# Patient Record
Sex: Male | Born: 1985 | Hispanic: Yes | Marital: Married | Smoking: Never smoker
Health system: Southern US, Community
[De-identification: ages and names within clinical notes are randomized; demographics above are authoritative.]

## PROBLEM LIST (undated history)

## (undated) HISTORY — PX: INNER EAR SURGERY: SHX679

---

## 2008-06-19 ENCOUNTER — Emergency Department: Payer: Self-pay | Admitting: Emergency Medicine

## 2011-05-08 ENCOUNTER — Emergency Department: Payer: Self-pay | Admitting: Emergency Medicine

## 2011-10-22 ENCOUNTER — Emergency Department: Payer: Self-pay | Admitting: Unknown Physician Specialty

## 2015-12-22 ENCOUNTER — Emergency Department: Payer: No Typology Code available for payment source

## 2015-12-22 ENCOUNTER — Encounter: Payer: Self-pay | Admitting: Emergency Medicine

## 2015-12-22 ENCOUNTER — Emergency Department
Admission: EM | Admit: 2015-12-22 | Discharge: 2015-12-22 | Disposition: A | Discharge status: Home / Self Care | Payer: No Typology Code available for payment source | Attending: Emergency Medicine | Admitting: Emergency Medicine

## 2015-12-22 DIAGNOSIS — M25511 Pain in right shoulder: Secondary | ICD-10-CM | POA: Insufficient documentation

## 2015-12-22 DIAGNOSIS — Y999 Unspecified external cause status: Secondary | ICD-10-CM | POA: Insufficient documentation

## 2015-12-22 DIAGNOSIS — M545 Low back pain, unspecified: Secondary | ICD-10-CM

## 2015-12-22 DIAGNOSIS — S199XXA Unspecified injury of neck, initial encounter: Secondary | ICD-10-CM | POA: Diagnosis present

## 2015-12-22 DIAGNOSIS — Y939 Activity, unspecified: Secondary | ICD-10-CM | POA: Insufficient documentation

## 2015-12-22 DIAGNOSIS — Y929 Unspecified place or not applicable: Secondary | ICD-10-CM | POA: Insufficient documentation

## 2015-12-22 DIAGNOSIS — S161XXA Strain of muscle, fascia and tendon at neck level, initial encounter: Secondary | ICD-10-CM | POA: Diagnosis not present

## 2015-12-22 MED ORDER — CYCLOBENZAPRINE HCL 10 MG PO TABS: 10.0000 mg | ORAL_TABLET | Freq: Every day | ORAL | Status: AC

## 2015-12-22 MED ORDER — NAPROXEN 500 MG PO TABS: 500.0000 mg | ORAL_TABLET | Freq: Two times a day (BID) | ORAL | Status: DC

## 2015-12-22 NOTE — ED Notes (Signed)
Pt verbalized understanding of discharge instructions. NAD at this time. 
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
## 2015-12-22 NOTE — Discharge Instructions (Signed)
Ejercicios para la espalda (Back Exercises) Los siguientes ejercicios fortalecen los msculos que dan soporte a la espalda y, [place], ayudan a Theatre manager la flexibilidad de la zona lumbar. Hacer estos ejercicios puede ser de ayuda para Information systems manager de espalda o Best boy actual. Si tiene dolor o molestias en la espalda, intente hacer estos ejercicios 2 o 3veces por da, o como se lo haya indicado el mdico. Cuando el dolor desaparezca, hgalos una vez por da, pero haga ms repeticiones de cada ejercicio. Si no tiene dolor o [employer] espalda, haga estos ejercicios una vez por da o como se lo haya indicado el mdico. EJERCICIOS Rodilla al pecho Repita estos pasos 3 o 5veces con cada pierna:  Acustese boca arriba sobre una cama dura o sobre el suelo con las piernas extendidas.  Lleve una rodilla al pecho. La otra pierna debe quedar extendida y en contacto con el suelo.  Mantenga la rodilla contra el pecho. Para lograrlo tmese la rodilla o el muslo.  Tire de la rodilla hasta sentir una elongacin suave en la parte baja de la espalda.  Mantenga la elongacin durante 10 a 30segundos.  Suelte y extienda la pierna lentamente. Inclinacin de la pelvis Repita estos pasos 5 o 10veces:  Acustese boca arriba sobre una cama dura o sobre el suelo con las piernas extendidas.  Flexione las rodillas de modo que apunten al techo y los pies queden apoyados en el suelo.  Contraiga los msculos de la parte baja del abdomen para empujar la zona lumbar contra el suelo. Con este movimiento se inclinar la pelvis de modo que el cccix apunte hacia el techo, en lugar de apuntar en direccin a los pies o al suelo.  Contraiga suavemente y respire con normalidad mientras mantiene esta posicin durante 5 a 10segundos. El perro y el gato Repita estos pasos hasta que la zona lumbar se vuelva ms flexible:  [place] manos y las rodillas sobre una superficie firme. Las manos  deben estar alineadas con los hombros y las rodillas con las caderas. Puede colocarse almohadillas debajo de las rodillas para estar cmodo.  Deje caer la cabeza y baje el cccix en direccin al suelo de modo que la zona lumbar se arquee como el lomo de un [place].  Mantenga esta posicin durante 5segundos.  Lentamente, levante la cabeza y eleve el cccix de modo que apunte en direccin al techo para que la espalda forme un arco hundido como el lomo de un perro contento.  Mantenga esta posicin durante 5segundos. Flexiones de English as a second language teacher pasos 5 o 10veces:  Acustese boca abajo en el suelo.  [place] manos cerca de la cabeza, separadas aproximadamente al ancho de los hombros.  Con la espalda lo ms relajada posible y las caderas apoyadas en el suelo, extienda lentamente los brazos para levantar la mitad superior del cuerpo y Community education officer los hombros. No use los msculos de la espalda para elevar la parte superior del torso. Puede cambiar las manos de lugar para estar ms cmodo.  Mantenga esta posicin durante 5segundos mientras mantiene la espalda relajada.  Lentamente vuelva a la posicin horizontal. Puentes Repita estos pasos 10veces:  Acustese boca arriba sobre una superficie firme.  Flexione las rodillas de modo que apunten al techo y los pies queden apoyados en el suelo.  Contraiga los glteos y despegue las nalgas del suelo hasta que la cintura est casi a la misma altura que las rodillas. Debe  sentir el trabajo muscular en las nalgas y la parte posterior de los muslos. Si no siente el esfuerzo de [employer], aleje los pies 1 o 2pulgadas (2,5 o 5centmetros) de las nalgas.  Mantenga esta posicin durante 3 a 5segundos.  Baje lentamente las caderas a la posicin inicial y relaje los glteos por completo. Si este ejercicio le resulta muy fcil, intente realizarlo con los brazos cruzados [place]. Abdominales Repita estos pasos 5 o  10veces:  Acustese boca arriba sobre una cama dura o sobre el suelo con las piernas extendidas.  Flexione las rodillas de modo que apunten al techo y los pies queden apoyados en el suelo.  Cruce los World Fuel Services Corporation.  Baje levemente el mentn en direccin al pecho sin doblar el cuello.  Contraiga los msculos del abdomen y con lentitud eleve el torso lo suficiente como para Artist los omplatos del suelo. No eleve el torso ms alto que eso, porque esto puede sobreexigir a la zona lumbar y no ayuda a Investment banker, operational.  Regrese lentamente a la posicin inicial. Elevaciones de espalda Repita estos pasos 5 o 10veces:  Acustese boca abajo con los brazos a los costados del cuerpo y apoye la frente en el suelo.  Contraiga los msculos de las piernas y los glteos.  Lentamente despegue el pecho del suelo mientras mantiene las caderas bien apoyadas en el suelo. Mantenga la nuca alineada con la curvatura de la espalda. Los ojos deben mirar al suelo.  Mantenga esta posicin durante 3 a 5segundos.  Regrese lentamente a la posicin inicial. SOLICITE ATENCIN MDICA SI:  El dolor o las molestias en la espalda se vuelven mucho ms intensos cuando hace un ejercicio.  El dolor o las molestias en la espalda no se Copy en el trmino de las 2horas posteriores a Copy. Si tiene alguno de [employer] Brands, deje de [employer]AM[employer] Corporation ejercicios de inmediato. No vuelva a hacer los ejercicios a menos que el mdico lo autorice. SOLICITE ATENCIN MDICA DE INMEDIATO SI:  Siente un dolor sbito e intenso en la espalda. Si esto ocurre, deje de [employer] Corporation ejercicios de inmediato. No vuelva a hacer los ejercicios a menos que el mdico lo autorice.   Esta informacin no tiene Theme park manager el consejo del mdico. Asegrese de hacerle al mdico cualquier pregunta que tenga.   Document Released: 07/28/2005 Document Revised: 04/18/2015 Elsevier Interactive  Patient Education [employer]   Distensin muscular. (Muscle Strain) Una distensin muscular es una lesin que se produce cuando un msculo se estira ms all de su largo normal. Cuando esto sucede, por lo general se desgarra un pequeo nmero de fibras musculares. La distensin muscular se califica en grados. Las distensiones de Museum/gallery conservator son aquellas en las cuales el desgarro y el dolor afectan a la menor cantidad de fibras musculares. Las distensiones de segundo y tercer grado involucran una proporcin cada vez mayor de desgarro y Engineer, mining.  En general, la recuperacin de una distensin muscular tarda de 1 a 2semanas. La curacin completa tarda de 5 a 6semanas.  CAUSAS  Las distensiones musculares ocurren cuando se aplica una fuerza violenta y repentina sobre un msculo y este se estira demasiado. Esto puede ocurrir cuando se Hydrographic surveyor, se practican deportes o en una cada.  FACTORES DE RIESGO La distensin muscular es especialmente comn en los atletas.  SIGNOS Y SNTOMAS En el lugar de la distensin muscular se puede presentar lo siguiente:  Dolor.  Moretones.  Hinchazn.  Dificultad para usar el msculo debido al dolor o a un funcionamiento anormal. DIAGNSTICO  El mdico le har un examen fsico y le har preguntas sobre sus antecedentes mdicos. TRATAMIENTO  Con frecuencia, el mejor tratamiento para una distensin muscular es el reposo, y la aplicacin de hielo y de compresas fras en la zona de la lesin.  INSTRUCCIONES PARA EL CUIDADO EN EL HOGAR   Use el mtodo PRICE (por sus siglas en ingls) de tratamiento para estimular la curacin durante los primeros 2 a 3das posteriores a la lesin. El mtodo PRICE implica lo siguiente:  Proteger al msculo de nuevas lesiones.  Limitar la actividad y Lawyer la parte del cuerpo lesionada.  Aplicar hielo a la lesin. Para hacerlo, ponga hielo en una bolsa plstica. Coloque una toalla entre la piel y la bolsa de  hielo. Luego aplique el hielo y djelo actuar de 15 a por hora. Despus del Press photographer, cambie a compresas de calor hmedo.  Comprimir la zona lesionada con una frula o venda elstica. Tenga cuidado de no ajustarla demasiado. Esto puede interferir con la circulacin sangunea o aumentar la hinchazn.  Mantener la zona lesionada por encima del nivel del corazn con la mayor frecuencia posible.  Utilice los medicamentos de venta libre o recetados para Primary school teacher, el malestar o la fiebre, segn se lo indique el mdico.  Education officer, environmental un calentamiento antes de hacer ejercicio ayuda a prevenir distensiones musculares futuras. SOLICITE ATENCIN MDICA SI:   Siente un dolor cada vez ms intenso o hinchazn en la zona lesionada.  Siente adormecimiento, hormigueo o nota una prdida importante de fuerza en la zona lesionada. ASEGRESE DE QUE:   Comprende estas instrucciones.  Controlar su afeccin.  Recibir ayuda de inmediato si no mejora o si empeora.   Esta informacin no tiene Theme park manager el consejo del mdico. Asegrese de hacerle al mdico cualquier pregunta que tenga.   Document Released: 05/07/2005 Document Revised: 05/18/2013 Elsevier Interactive Patient Education 2016 [employer]. Dolor de espalda en adultos (Back Pain, Adult) El dolor de espalda es muy frecuente. A menudo mejora con el tiempo. La causa del dolor de espalda generalmente no es peligrosa. La Harley[employer]Davidson de las personas puede aprender a Runner, broadcasting/film/video de espalda por s mismas.  CUIDADOS EN EL HOGAR  Controle su dolor de espalda a fin de Public house manager cambio. Las siguientes indicaciones ayudarn a Psychologist, clinical que pueda sentir:  Materials engineer. Comience con caminatas cortas sobre superficies planas si es posible. Trate de caminar un poco ms cada da.  Haga ejercicios con regularidad tal como le indic el mdico. El ejercicio ayuda a que su espalda se cure ms rpidamente. Tambin  ayuda a prevenir futuras lesiones al [employer] fuertes y flexibles.  No se siente, conduzca ni permanezca de pie durante ms de 30 minutos.  No permanezca en la cama. Si hace reposo ms de 1 a 2 das, puede demorar su recuperacin.  Sea cuidadoso al inclinarse o levantar un objeto. Use una tcnica apropiada para levantar peso:  Flexione las rodillas.  Mantenga el objeto cerca del cuerpo.  No gire.  Duerma sobre un NVR Inc. Recustese sobre un costado y flexione las rodillas. Si se recuesta Fisher Scientific, coloque una almohada debajo de las rodillas.  Tome los medicamentos solamente como se lo haya indicado el mdico.  Aplique hielo sobre la zona lesionada.  Ponga el hielo en una bolsa plstica.  Coloque una FirstEnergy Corp  piel y la bolsa de hielo.  Deje el hielo durante , 2 a 3veces por da, durante los primeros 2 o 3das. Despus de eso, puede alternar entre compresas de hielo y Airline pilot.  Evite sentir ansiedad o estrs. Encuentre maneras efectivas de lidiar con el estrs, Surveyor, mining ejercicio.  Mantenga un peso saludable. El peso excesivo ejerce tensin sobre la espalda. SOLICITE AYUDA SI:   Siente dolor que no se alivia con reposo o medicamentos.  Siente cada vez ms dolor que se extiende a las piernas o los glteos.  El dolor no mejora en una semana.  Siente dolor por la noche.  Pierde peso.  Siente escalofros o fiebre. SOLICITE AYUDA DE INMEDIATO SI:   No puede controlar su materia fecal (heces) o el pis (orina).  Siente debilidad en las piernas o los brazos.  Siente prdida de la sensibilidad (adormecimiento) en las piernas o los brazos.  Tiene malestar estomacal (nuseas) o vomita.  Siente dolor de estmago (abdominal).  Siente que se desvanece (se desmaya).   Esta informacin no tiene Theme park manager el consejo del mdico. Asegrese de hacerle al mdico cualquier pregunta que tenga.   Document Released: 02/10/2011  Document Revised: 08/18/2014 Elsevier Interactive Patient Education 2016 [employer].   Crioterapia  (Cryotherapy)  El trmino crioterapia significa tratamiento mediante el fro. Bolsas con hielo o gel se utilizan para reducir Chief Technology Officer y la inflamacin. El hielo es ms efectivo dentro de las primeras 24 a 48 horas despus de una lesin o trastornos por uso excesivo de un msculo o Risk analyst. El hielo puede calmar esguinces, distensiones, espasmos, ardor, dolor punzante y Valero Energy. Tambin puede usarse para la recuperacin luego de [place]. El hielo es [place], tiene muy pocos efectos adversos y es seguro para que lo utilicen la mayora de [employer]on.  PRECAUCIONES  El hielo no es una opcin segura de tratamiento para las personas con:   Fenmeno de [place]. Este es un trastorno que afecta los vasos sanguneos pequeos en las extremidades. La exposicin al fro DTE Energy Company problemas vuelvan.  Hipersensibilidad al fro. Hay diferentes tipos de hipersensibilidad al fro, [employer] se incluyen:  Urticaria por el fro. Ronchas rojas y que pican que aparecen en la piel cuando los tejidos comienzan a calentarse despus de recibir el fro.  Eritema por fro. Se trata de una erupcin de color rojo y que pica, causada por la exposicin al fro.  Hemoglobinuria por fro. Los glbulos rojos se destruyen cuando los tejidos comienzan a calentarse despus de enfriarse. La hemoglobina que transporta oxgeno pasa a la orina debido a que no se puede combinar con protenas de la sangre lo suficientemente rpido.  Entumecimiento o alteracin de la sensibilidad en el rea que se enfra. Si usted tiene Health Net, no utilice hielo hasta que haya hablado con su mdico:   Enfermedades cardacas, como arritmias, angina o enfermedad cardaca crnica.  Hipertensin arterial.  Heridas que se estn curando o abiertas en la zona en la que va a aplicar el  hielo.  Infecciones actuales.  Artritis reumatoidea.  Mala circulacin.  Diabetes. El hielo disminuye el flujo de sangre en la regin en la que se aplica. Esto es beneficioso cuando se trata de evitar que se propaguen ciertas sustancias qumicas irritantes desde los tejidos inflamados a los tejidos circundantes. Sin embargo, si se expone la piel a las temperaturas fras durante demasiado tiempo o sin la proteccin [place], puede daarse la piel  o los nervios. Observe si hay seales de dao en la piel debido al fro.  INSTRUCCIONES PARA EL CUIDADO EN EL HOGAR  Siga estos consejos para usar hielo y compresas fras con seguridad.   Coloque una toalla seca o hmeda entre el hielo y la piel. Una toalla hmeda se enfriar ms rpidamente la piel, lo que puede hacer necesario acortar el tiempo que se utiliza el hielo.  Para obtener una respuesta ms rpida, puede comprimir suavemente el hielo.  Aplique el hielo durante no ms de 10 a 20 minutos a la vez. Cuanto ms hueso haya en la zona en la que aplique el hielo, menos tiempo se necesitar para obtener los beneficios.  Revise su piel despus de 5 minutos para asegurarse de que no hay seales de [employer] al fro o un dao en la piel.  Descanse 20 minutos o ms Union Pacific Corporation.  Una vez que la piel est adormecida, puede finalizar el [place]. Puede probar si hay adormecimiento tocando ligeramente la piel. El toque debe ser tan ligero que no deje un hoyuelo en la piel por la presin hecha con la punta del dedo. Al aplicar hielo, la Harley-Davidson de las personas sentirn sensaciones normales en este orden: fro, ardor, dolor y entumecimiento.  No use hielo sobre alguien que no puede comunicar sus respuestas al dolor, como los nios pequeos o personas con demencia. CMO HACER UNA COMPRESA DE HIELO  Las compresas de hielo son el modo ms frecuente de Chemical engineer la terapia con hielo. Otros mtodos son los masajes con hielo, baos de  hielo, y aerosol fro. Las cremas musculares que producen fro, sensacin de hormigueo no ofrecen los mismos beneficios que ofrece el hielo y no debe ser utilizado como un sustituto excepto que lo recomiende su mdico.  Para hacer una compresa de hielo, haga lo siguiente:   Ponga hielo picado o una bolsa de verduras congeladas en una bolsa de plstico con cierre. Extraiga el exceso de [place]. Coloque esta bolsa dentro de [place] bolsa de plstico. Deslice la bolsa en una funda de almohada o coloque una toalla hmeda entre su piel y la [place].  Mezcle 3 partes de agua con 1 parte de alcohol fino. Congelar la mezcla en una bolsa plstica con cierre. Cuando se retira Set designer del Electrical engineer, tendr un aspecto fangoso. Extraiga el exceso de [place]. Coloque esta bolsa dentro de [place] bolsa de plstico. Deslice la bolsa en una funda de almohada o coloque una toalla hmeda entre su piel y la [place]. SOLICITE ATENCIN MDICA SI:   Tiene manchas blancas en la piel. Esto puede dar a la piel una apariencia (moteada).  Su piel se vuelve azul o plida.  Tiene un aspecto ceroso o est dura.  La hinchazn empeora. ASEGRESE DE QUE:   Comprende estas instrucciones.  Controlar su enfermedad.  Solicitar ayuda de inmediato si no mejora o si empeora.   Esta informacin no tiene Theme park manager el consejo del mdico. Asegrese de hacerle al mdico cualquier pregunta que tenga.   Document Released: 07/17/2011 Document Revised: 10/20/2011 Elsevier Interactive Patient Education 2016 Elsevier Inc.  Terapia con calor (Heat Therapy) La terapia con calor puede ayudar a aliviar articulaciones y msculos doloridos, lesionados, tensos y rgidos. El calor Mirant, lo cual puede ayudar a Engineer, materials. La terapia con calor solo se debe usar en lesiones viejas, preexistentes o de larga duracin (crnicas). No use la terapia con calor a menos que se lo haya indicado  el mdico. CMO USAR LA TERAPIA CON  CALOR Existen varios tipos distintos de terapia con calor, como:  Compresas hmedas calientes.  Bao de agua caliente.  Bolsa de agua caliente.  Almohadilla trmica.  Bolsa de gel caliente.  Vendaje caliente.  Almohadilla trmica. RECOMENDACIONES GENERALES PARA LA TERAPIA CON CALOR   No duerma mientras [place] la terapia con calor. Utilice la terapia con calor solo mientras est despierto.  La piel puede volverse rosada mientras [place] la terapia con calor. No use la terapia con calor si la piel se pone roja.  No use la terapia con calor si siente un dolor nuevo.  Una temperatura muy alta o una exposicin prolongada al calor puede causar quemaduras. Sea cauto con la terapia de calor para evitar quemar la piel.  No use la terapia con calor en zonas de la piel que ya estn irritadas, como con una erupcin o una quemadura de sol. SOLICITE AYUDA SI:   Observa ampollas, enrojecimiento, hinchazn o adormecimiento.  Siente un dolor nuevo.  El dolor [place] Cong[place]eempeora. ASEGRESE DE QUE:  Comprende estas instrucciones.  Controlar su afeccin.  Recibir ayuda de inmediato si no mejora o si empeora.   Esta informacin no tiene Theme park managercomo fin reemplazar el consejo del mdico. Asegrese de hacerle al mdico cualquier pregunta que tenga.   Document Released: 10/20/2011 Document Revised: 08/18/2014 Elsevier Interactive Patient Education 2016 Arvin[employer]itor[employer]vier Inc.  Colisin con un vehculo de motor (Tourist information centre managerMotor Vehicle Collision) Despus de sufrir un accidente automovilstico, es normal tener diversos hematomas y Smith Internationaldolores musculares. Generalmente, estas molestias son peores durante las primeras 24 horas. En las primeras horas, probablemente sienta mayor entumecimiento y Engineer, miningdolor. Tambin puede sentirse peor al despertarse la maana posterior a la colisin. A partir de all, debera comenzar a Associate Professormejorar da a da. La velocidad con que se mejora generalmente depende de la gravedad de la colisin y la cantidad, Chinaubicacin y  Firefighternaturaleza de las lesiones. INSTRUCCIONES PARA EL CUIDADO EN EL HOGAR   Aplique hielo sobre la zona lesionada.  Ponga el hielo en una bolsa plstica.  Colquese una toalla entre la piel y la bolsa de hielo.  Deje el hielo durante 15 a 20minutos, 3 a 4veces por da, o segn las indicaciones del mdico.  Albesa SeenBeba suficiente lquido para mantener la orina clara o de color amarillo plido. No beba alcohol.  Tome una ducha o un bao tibio una o dos veces al da. Esto aumentar el flujo de Computer Sciences Corporationsangre hacia los msculos doloridos.  Puede retomar sus actividades normales cuando se lo indique el mdico. Tenga cuidado al levantar objetos, ya que puede agravar el dolor en el cuello o en la espalda.  Utilice los medicamentos de venta libre o recetados para Primary school teachercalmar el dolor, el malestar o la fiebre, segn se lo indique el mdico. No tome aspirina. Puede aumentar los hematomas o la hemorragia. SOLICITE ATENCIN MDICA DE INMEDIATO SI:  Tiene entumecimiento, hormigueo o debilidad en los brazos o las piernas.  Tiene dolor de cabeza intenso que no mejora con medicamentos.  Siente un dolor intenso en el cuello, especialmente con la palpacin en el centro de la espalda o el cuello.  Disminuye su control de la vejiga o los intestinos.  Aumenta el dolor en cualquier parte del cuerpo.  Le falta el aire, tiene sensacin de desvanecimiento, mareos o Newell Rubbermaiddesmayos.  Siente dolor en el pecho.  Tiene malestar estomacal (nuseas), vmitos o sudoracin.  Cada vez siente ms dolor abdominal.  Anola Gurneybserva sangre en la [place], en la materia fecal  o en el vmito.  Siente dolor en los hombros (en la zona del cinturn de seguridad).  Siente que los sntomas empeoran. ASEGRESE DE QUE:   Comprende estas instrucciones.  Controlar su afeccin.  Recibir ayuda de inmediato si no mejora o si empeora.   Esta informacin no tiene Theme park manager el consejo del mdico. Asegrese de hacerle al mdico cualquier pregunta  que tenga.   Document Released: 05/07/2005 Document Revised: 08/18/2014 Elsevier Interactive Patient Education Yahoo! Inc.
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
## 2017-12-21 IMAGING — CR DG SHOULDER 2+V*R*
1 series · 3 of 3 positions shown · non-contrast
Comparison: None.

CLINICAL DATA: Pain after trauma

EXAM:
RIGHT SHOULDER - 2+ VIEW

[Series 1: dg shoulder right · 0.14mm/px · 3 of 3 slices shown]
[im 1/3]
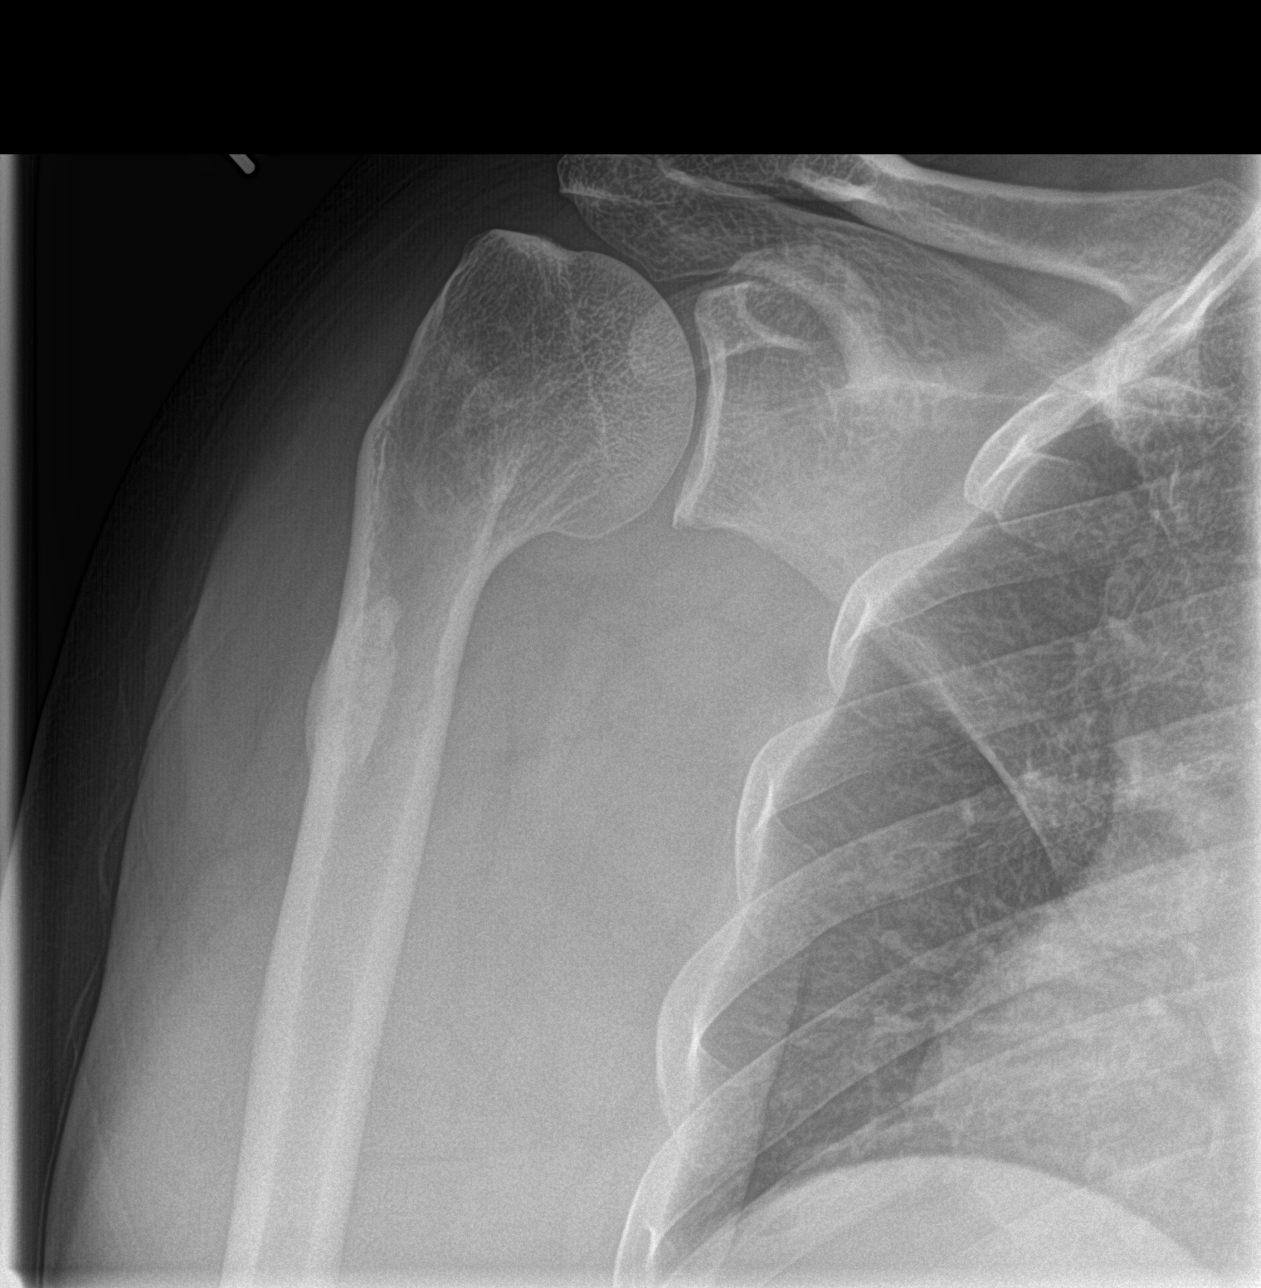
[im 2/3]
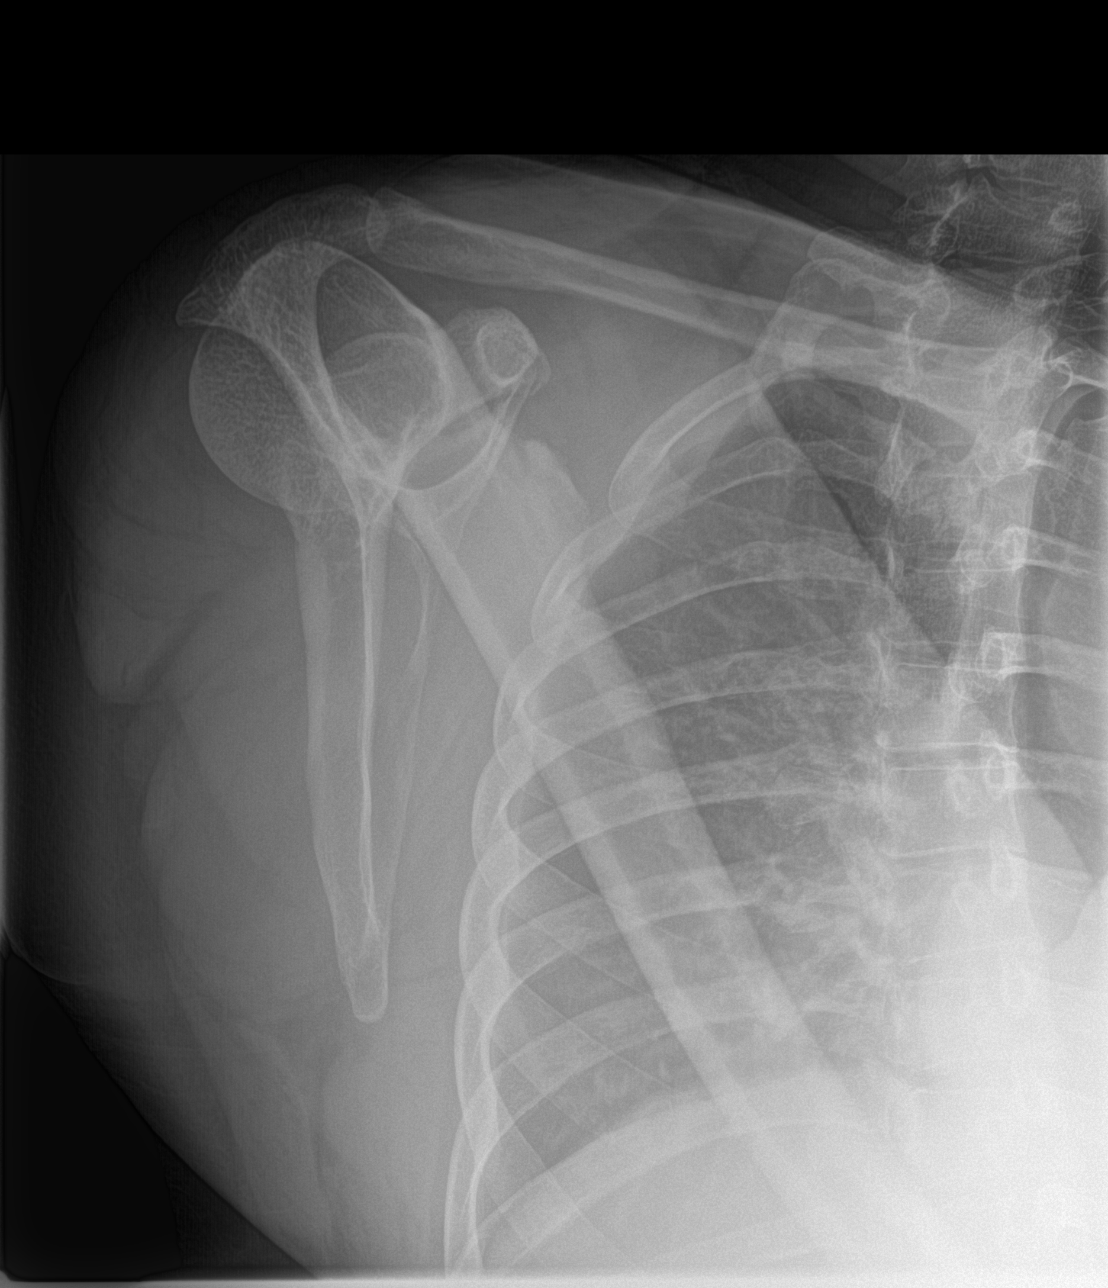
[im 3/3]
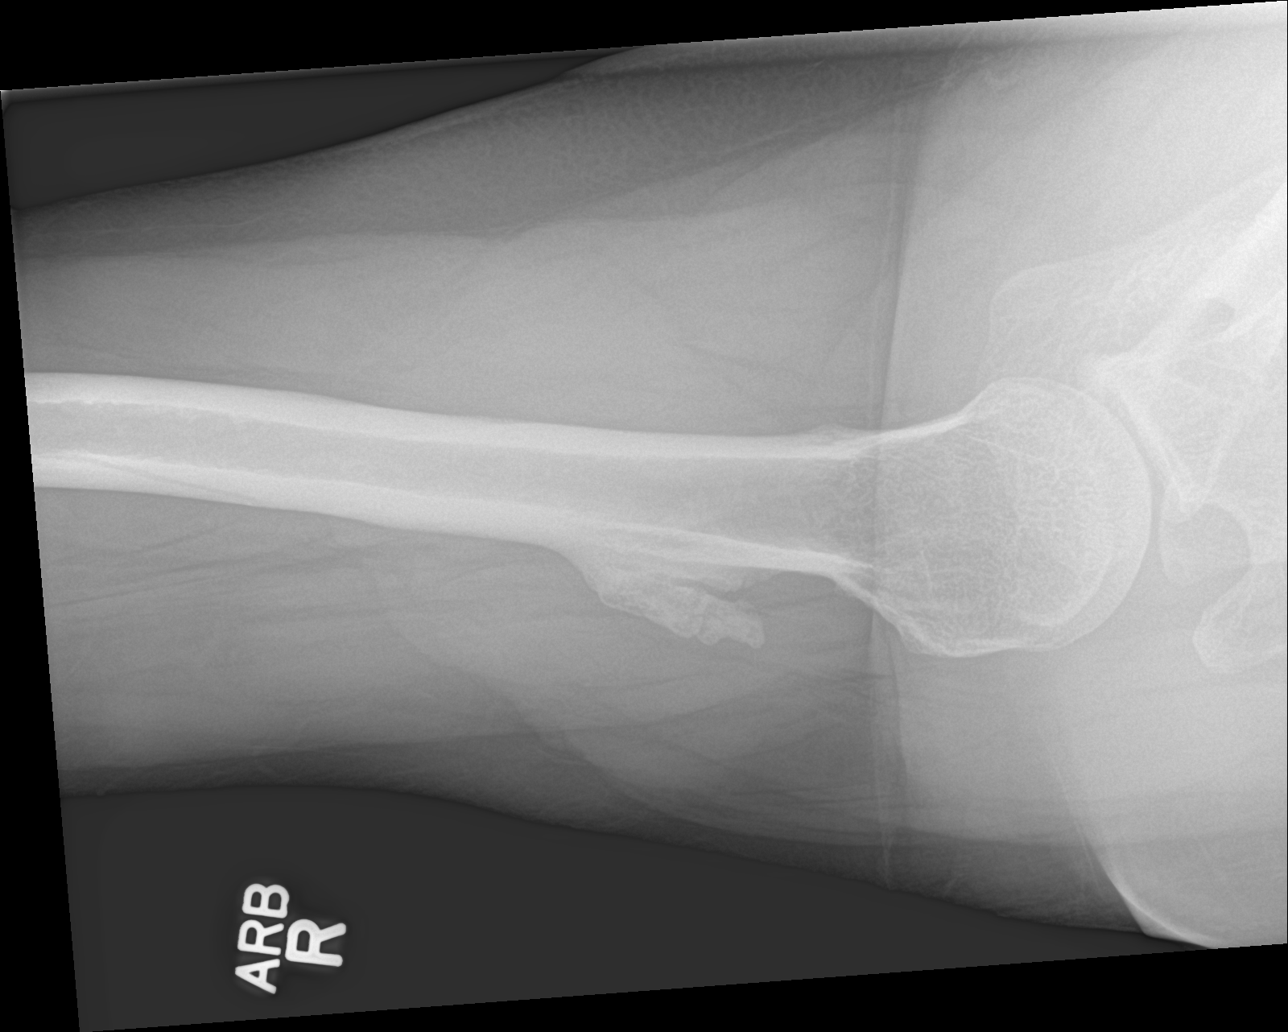

[3 of 3 positions shown; findings below may reference images not displayed]

FINDINGS: There is no acute fracture or dislocation associated with the right
shoulder. There is an angulation of the proximal right humerus,
likely related to previous trauma with no acute fracture seen. There
is a bony excrescence off of the proximal right humeral diaphysis,
probably an enchondroma. Sequela of previous trauma is less likely.
Limited views of the right chest are normal. Limited views of the
clavicle and scapula are unremarkable.
IMPRESSION: 1. No acute fracture or dislocation.
2. Angulation of the proximal right humerus, likely sequela of
previous trauma.
3. Bony excrescence off the proximal right humerus favored to
represent an enchondroma.

## 2024-07-26 ENCOUNTER — Encounter: Payer: Self-pay | Admitting: *Deleted

## 2024-07-26 ENCOUNTER — Emergency Department
Admission: EM | Admit: 2024-07-26 | Discharge: 2024-07-26 | Disposition: A | Discharge status: Home / Self Care | Payer: Worker's Compensation | Source: Ambulatory Visit | Attending: Emergency Medicine | Admitting: Emergency Medicine

## 2024-07-26 ENCOUNTER — Other Ambulatory Visit: Payer: Self-pay

## 2024-07-26 DIAGNOSIS — Z23 Encounter for immunization: Secondary | ICD-10-CM | POA: Diagnosis not present

## 2024-07-26 DIAGNOSIS — S61012A Laceration without foreign body of left thumb without damage to nail, initial encounter: Secondary | ICD-10-CM

## 2024-07-26 DIAGNOSIS — Y990 Civilian activity done for income or pay: Secondary | ICD-10-CM | POA: Diagnosis not present

## 2024-07-26 DIAGNOSIS — S6992XA Unspecified injury of left wrist, hand and finger(s), initial encounter: Secondary | ICD-10-CM | POA: Diagnosis present

## 2024-07-26 DIAGNOSIS — W260XXA Contact with knife, initial encounter: Secondary | ICD-10-CM | POA: Diagnosis not present

## 2024-07-26 MED ORDER — TETANUS-DIPHTH-ACELL PERTUSSIS 5-2-15.5 LF-MCG/0.5 IM SUSP
0.5000 mL | Freq: Once | INTRAMUSCULAR | Status: AC
  Administered 2024-07-26: 17:00:00 0.5 mL via INTRAMUSCULAR
  Filled 2024-07-26: qty 0.5

## 2024-07-26 MED ORDER — LIDOCAINE HCL (PF) 1 % IJ SOLN
5.0000 mL | Freq: Once | INTRAMUSCULAR | Status: AC
  Administered 2024-07-26: 17:00:00 5 mL
  Filled 2024-07-26: qty 5

## 2024-07-26 NOTE — ED Triage Notes (Signed)
 Pt ambulatory to triage.  Pt has laceration to left thumb.  Pt cut thumb on a razor at work today  bleeding controlled.  Pt states WC

## 2024-07-26 NOTE — ED Notes (Signed)
 Drug testing completed and walked to lab by [place], NT

## 2024-07-26 NOTE — Discharge Instructions (Addendum)
 You have been diagnosed with left thumb laceration.  Please do not submerge your finger in water.  Please make an appointment in [facility]door clinic or urgent care  for suture removal in 6 days.  Please come back to ED or go to PCP if you have new symptoms symptoms worsen. Le han diagnosticado una laceracin en el pulgar izquierdo. Por favor, no sumerja el dedo en agua. Solicite una cita en la clnica de atencin ambulatoria o en el servicio [facility] clinic para la extraccin de los puntos de sutura en 6 das. Regrese al servicio de urgencias o consulte con su mdico de cabecera si presenta nuevos sntomas o si los sntomas empeoran

## 2024-07-26 NOTE — ED Provider Notes (Signed)
 [facility] Provider Note    Event Date/Time   First MD Initiated Contact with Patient 07/26/24 1643     (approximate)   History   Laceration    HPI  Luis Benson is a 38 y.o. male    with a past medical history of bilateral hearing loss, right-sided low back pain, headache, who presents to the ED complaining of laceration left thumb. According to the patient, was opening a box with a knife and caught his left thumb finger.  Unknown last Tdap.  Patient went to [facility] but did not do it because they could not stop the bleeding.  Patient is here with his wife.  Patient is not taking blood thinners     There are no active problems to display for this patient.    Physical Exam   Triage Vital Signs: ED Triage Vitals  Encounter Vitals Group     BP 07/26/24 1552 124/86     Girls Systolic BP Percentile --      Girls Diastolic BP Percentile --      Boys Systolic BP Percentile --      Boys Diastolic BP Percentile --      Pulse Rate 07/26/24 1552 88     Resp 07/26/24 1552 18     Temp 07/26/24 1552 98.4 F (36.9 C)     Temp Source 07/26/24 1552 Oral     SpO2 07/26/24 1552 98 %     Weight 07/26/24 1549 150 lb (68 kg)     Height 07/26/24 1549 5' 1 (1.549 m)     Head Circumference --      Peak Flow --      Pain Score 07/26/24 1549 9     Pain Loc --      Pain Education --      Exclude from Growth Chart --     Most recent vital signs: Vitals:   07/26/24 1552  BP: 124/86  Pulse: 88  Resp: 18  Temp: 98.4 F (36.9 C)  SpO2: 98%     Physical Exam Vitals and nursing note reviewed.  During triage vital signs were normal.  General:          Awake, no distress.  CV:                  Good peripheral perfusion. Regular rate and rhythm. Resp:               Normal effort. no tachypnea.Equal breath sounds bilaterally.  Abd:                 No distention.  Soft nontender Other: Left thumb: Presence of laceration proximal phalange E  dorsal area.  Active bleeding.  Full ROM.  Incision is intact.             ED Results / Procedures / Treatments   Labs (all labs ordered are listed, but only abnormal results are displayed) Labs Reviewed - No data to display    PROCEDURES:  Critical Care performed:   .Laceration Repair  Date/Time: 07/26/2024 6:07 PM  Performed by: Janit Kast, PA-C Authorized by: Janit Kast, PA-C   Consent:    Consent obtained:  Verbal   Consent given by:  Patient   Risks, benefits, and alternatives were discussed: yes     Risks discussed:  Pain Universal protocol:    Procedure explained and questions answered to patient or proxy's satisfaction: yes  Patient identity confirmed:  Verbally with patient Anesthesia:    Anesthesia method:  Local infiltration   Local anesthetic:  Lidocaine  1% WITH epi Laceration details:    Location:  Finger   Length (cm):  0.5   Depth (mm):  3 Pre-procedure details:    Preparation:  Patient was prepped and draped in usual sterile fashion Exploration:    Limited defect created (wound extended): no     Hemostasis achieved with:  Direct pressure   Imaging outcome: foreign body not noted   Treatment:    Area cleansed with:  Povidone-iodine   Amount of cleaning:  Standard   Irrigation solution:  Sterile saline   Irrigation volume:  300   Irrigation method:  Pressure wash   Visualized foreign bodies/material removed: no     Debridement:  None Skin repair:    Repair method:  Sutures   Suture size:  5-0   Suture material:  Nylon   Number of sutures:  3 Approximation:    Approximation:  Close Repair type:    Repair type:  Simple Post-procedure details:    Dressing:  Non-adherent dressing   Procedure completion:  Tolerated well, no immediate complications    MEDICATIONS ORDERED IN ED: Medications  lidocaine  (PF) (XYLOCAINE ) 1 % injection 5 mL (5 mLs Other Given by Other 07/26/24 1721)  Tdap (ADACEL ) injection 0.5 mL (0.5 mLs  Intramuscular Given 07/26/24 1720)      IMPRESSION / MDM / ASSESSMENT AND PLAN / ED COURSE  I reviewed the triage vital signs and the nursing notes.  Differential diagnosis includes, but is not limited to, laceration, avulsion, unlikely foreign body.  Patient's presentation is most consistent with acute, uncomplicated illness.   Luis Benson is a 38 y.o., male who presented today with laceration and left thumb.  Unknown last Tdap.  See HPI for further information.  On a physical exam vital signs were normal during triage.  Left thumb, there is a laceration about 0.3 cm length 3 mm depth, in the dorsal area of the proximal phalange.  Active bleeding.  Full ROM.  Sensation is intact.  Physical exam is normal. Plan Tdap Laceration repair Patient's diagnosis is consistent with left thumb laceration.  Did not order any imaging or labs.  Physical exam was reassuring I did review the patient's allergies and medications.The patient is in stable and satisfactory condition for discharge home  Patient will be discharged home without prescriptions . Patient is to follow up with urgent care in 6 days for suture removal as needed or otherwise directed. Patient is given ED precautions to return to the ED for any worsening or new symptoms.  Work note will be provided. Discussed plan of care with patient, answered all of patient's questions, patient agreeable to plan of care. Advised patient to take medications according to the instructions on the label. Discussed possible side effects of new medications. Patient verbalized understanding.  FINAL CLINICAL IMPRESSION(S) / ED DIAGNOSES   Final diagnoses:  Laceration of left thumb without foreign body without damage to nail, initial encounter     Rx / DC Orders   ED Discharge Orders     None        Note:  This document was prepared using Dragon voice recognition software and may include unintentional dictation errors.   Janit Kast,  PA-C 07/26/24 1809    Viviann Pastor, MD 07/26/24 2156
